# Patient Record
Sex: Female | Born: 1940 | Race: Black or African American | Hispanic: No | Marital: Married | Smoking: Former smoker
Health system: Southern US, Community
[De-identification: ages and names within clinical notes are randomized; demographics above are authoritative.]

## PROBLEM LIST (undated history)

## (undated) DIAGNOSIS — C069 Malignant neoplasm of mouth, unspecified: Secondary | ICD-10-CM

## (undated) DIAGNOSIS — M1990 Unspecified osteoarthritis, unspecified site: Secondary | ICD-10-CM

## (undated) DIAGNOSIS — C801 Malignant (primary) neoplasm, unspecified: Secondary | ICD-10-CM

## (undated) DIAGNOSIS — I10 Essential (primary) hypertension: Secondary | ICD-10-CM

## (undated) HISTORY — PX: ABDOMINAL HYSTERECTOMY: SHX81

---

## 2007-04-24 ENCOUNTER — Ambulatory Visit: Payer: Self-pay | Admitting: Dermatology

## 2007-05-03 ENCOUNTER — Ambulatory Visit: Payer: Self-pay | Admitting: Surgery

## 2008-02-02 ENCOUNTER — Ambulatory Visit: Payer: Self-pay | Admitting: Internal Medicine

## 2010-02-26 ENCOUNTER — Ambulatory Visit: Payer: Self-pay | Admitting: Oncology

## 2010-02-26 ENCOUNTER — Ambulatory Visit: Payer: Self-pay | Admitting: Internal Medicine

## 2010-03-03 ENCOUNTER — Ambulatory Visit: Payer: Self-pay | Admitting: Oncology

## 2010-03-28 ENCOUNTER — Ambulatory Visit: Payer: Self-pay | Admitting: Oncology

## 2013-02-26 ENCOUNTER — Emergency Department: Payer: Self-pay | Admitting: Emergency Medicine

## 2013-02-26 ENCOUNTER — Ambulatory Visit: Payer: Self-pay

## 2013-02-26 LAB — CBC
HCT: 35.9 % (ref 35.0–47.0)
MCHC: 33.3 g/dL (ref 32.0–36.0)
Platelet: 273 10*3/uL (ref 150–440)
RDW: 14.2 % (ref 11.5–14.5)
WBC: 7.3 10*3/uL (ref 3.6–11.0)

## 2013-02-26 LAB — COMPREHENSIVE METABOLIC PANEL
Albumin: 4 g/dL (ref 3.4–5.0)
Alkaline Phosphatase: 80 U/L (ref 50–136)
Anion Gap: 3 — ABNORMAL LOW (ref 7–16)
BUN: 10 mg/dL (ref 7–18)
Bilirubin,Total: 0.2 mg/dL (ref 0.2–1.0)
Calcium, Total: 9.4 mg/dL (ref 8.5–10.1)
Chloride: 105 mmol/L (ref 98–107)
Creatinine: 0.86 mg/dL (ref 0.60–1.30)
EGFR (Non-African Amer.): >60
Glucose: 106 mg/dL — ABNORMAL HIGH (ref 65–99)
Osmolality: 277 (ref 275–301)
Potassium: 3.6 mmol/L (ref 3.5–5.1)
SGOT(AST): 31 U/L (ref 15–37)

## 2013-02-26 LAB — URINALYSIS, COMPLETE
Bilirubin,UR: NEGATIVE
Blood: NEGATIVE
Ketone: NEGATIVE
RBC,UR: 1 /HPF (ref 0–5)
WBC UR: 4 /HPF (ref 0–5)

## 2015-05-31 IMAGING — CT CT HEAD WITHOUT CONTRAST
1 series · 16 of 29 positions shown, 20 images · non-contrast
Comparison: none

REASON FOR EXAM: dizzy
COMMENTS:

[Series 2: soft tissue · axial · 0.41mm/px · z∈[-192,-62]mm · 16 of 29 slices shown, 20 images]
[im 2/29  brain]
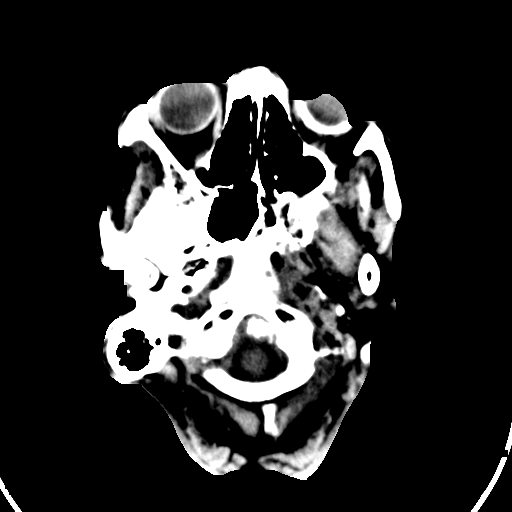
[im 2/29  bone]
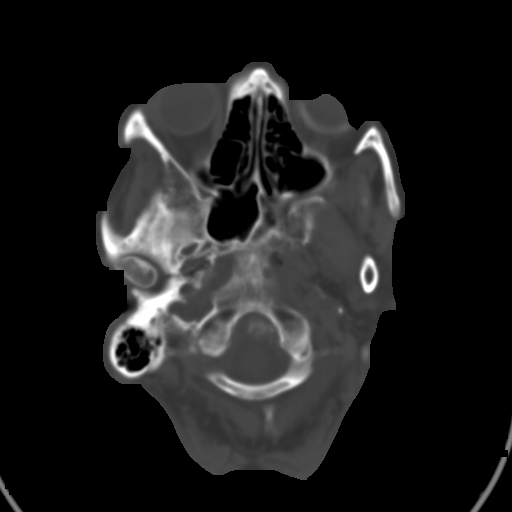
[im 4/29  brain]
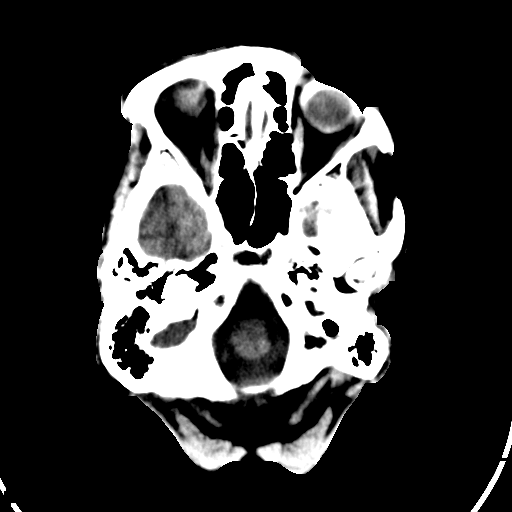
[im 6/29  brain]
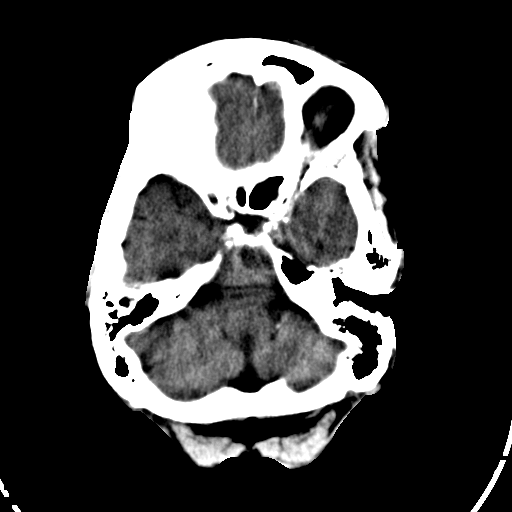
[im 7/29  brain]
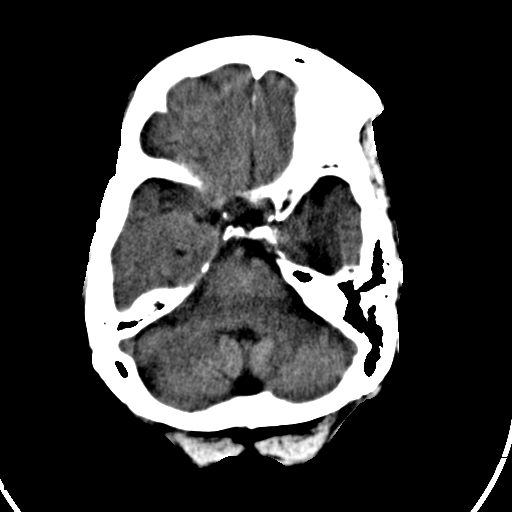
[im 9/29  brain]
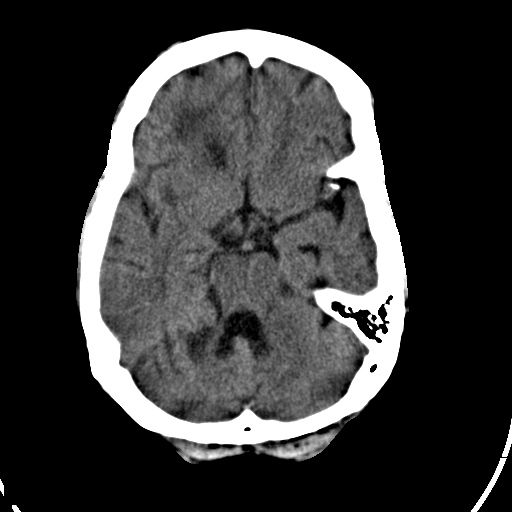
[im 9/29  bone]
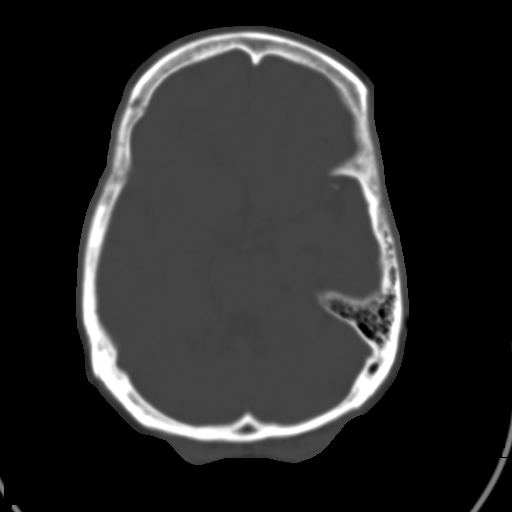
[im 11/29  brain]
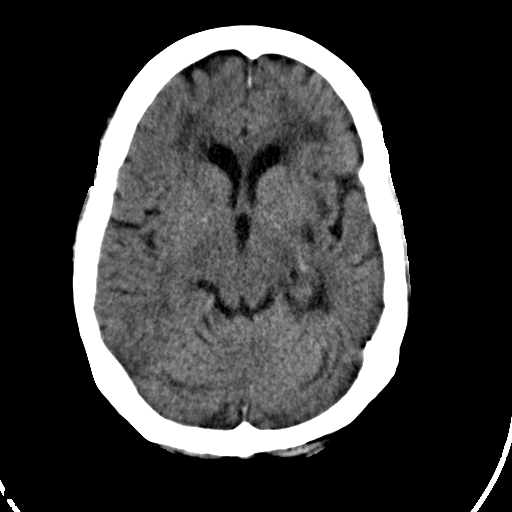
[im 12/29  brain]
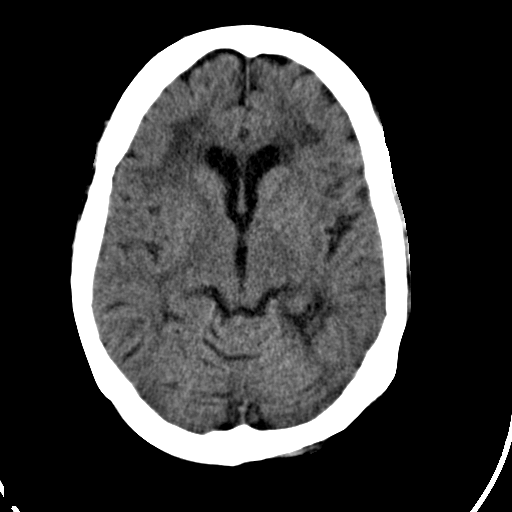
[im 14/29  brain]
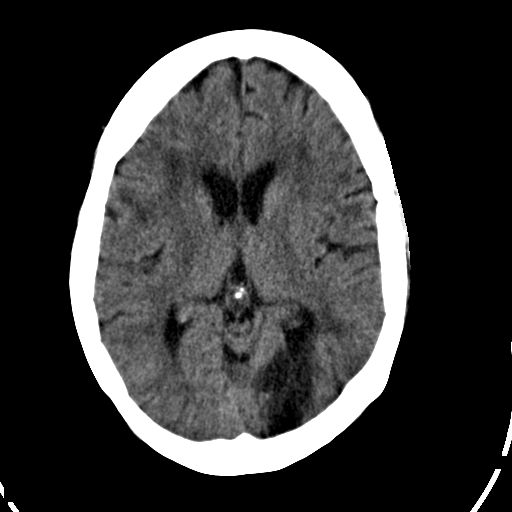
[im 16/29  brain]
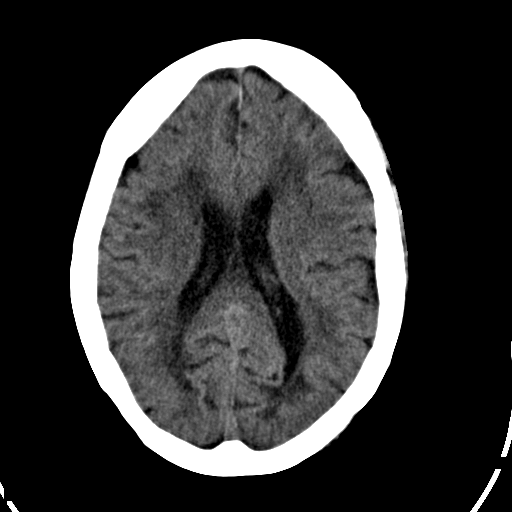
[im 16/29  bone]
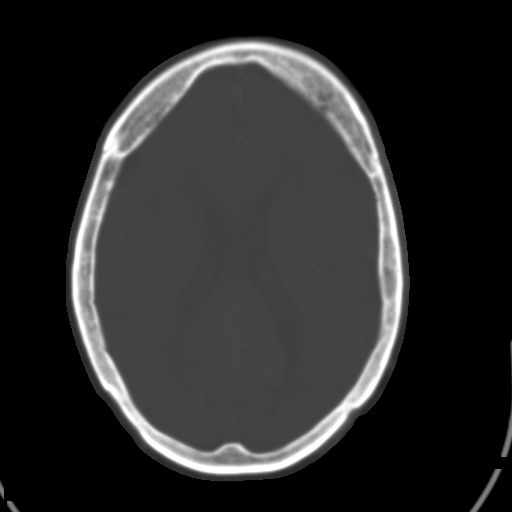
[im 18/29  brain]
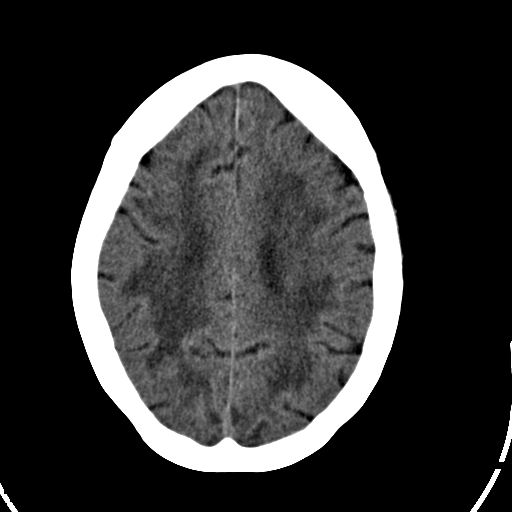
[im 19/29  brain]
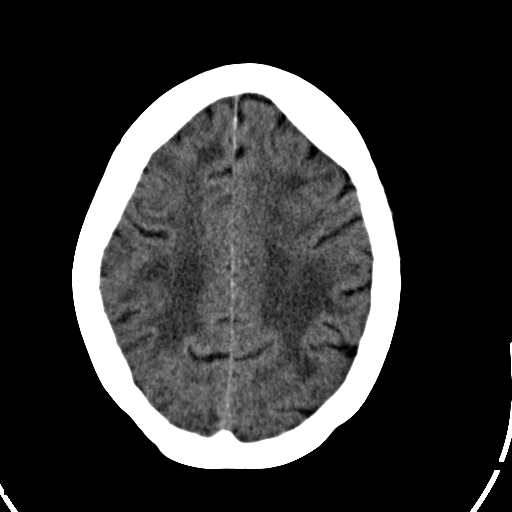
[im 21/29  brain]
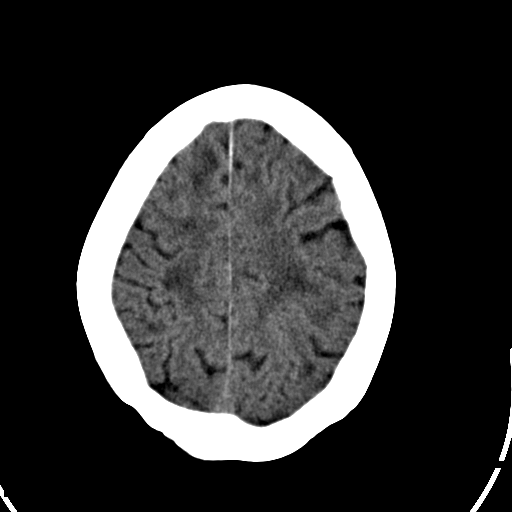
[im 23/29  brain]
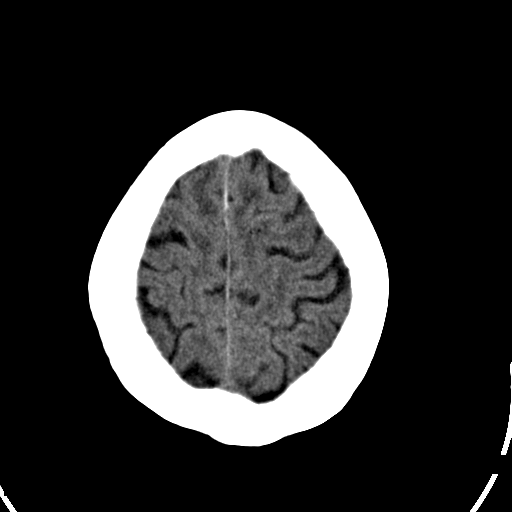
[im 23/29  bone]
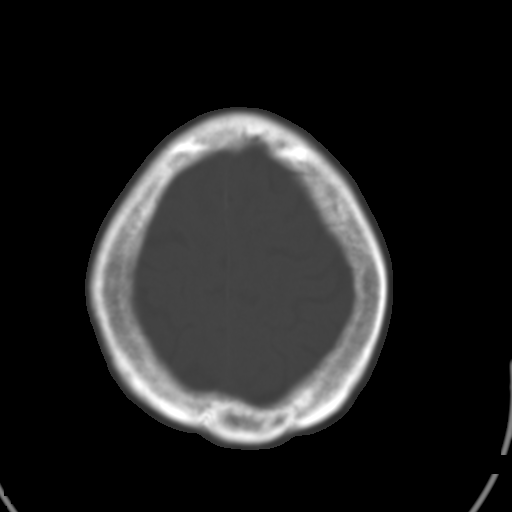
[im 24/29  brain]
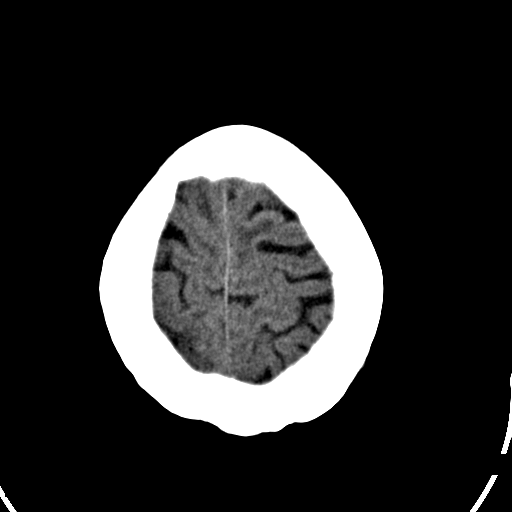
[im 26/29  brain]
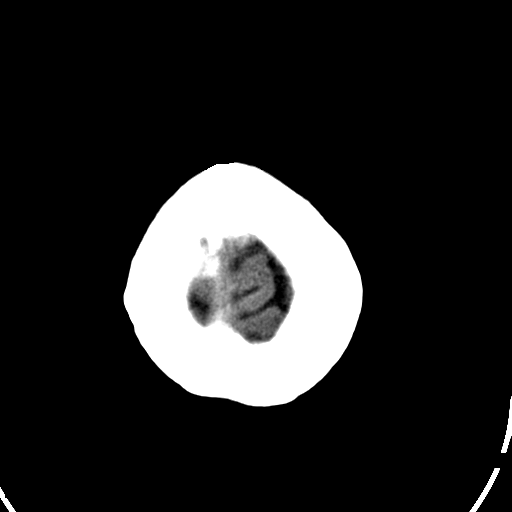
[im 28/29  brain]
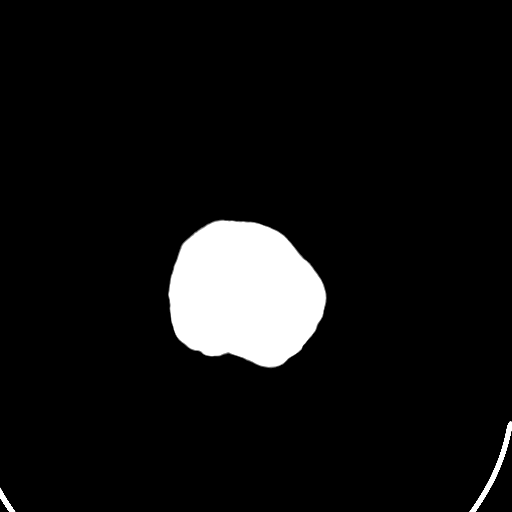

[16 of 29 positions shown; findings below may reference images not displayed]

PROCEDURE:     CT  - CT HEAD WITHOUT CONTRAST  - February 26, 2013  [DATE]

RESULT:     Noncontrast CT of the brain is performed. There is extensive
low-attenuation in the periventricular and subcortical white matter regions.
There is an old-appearing left occipital infarct area there is no evidence
of intracranial hemorrhage, mass, mass effect or midline shift. The sinuses
and mastoid air cells show normal appearing aeration. The calvarium is
intact.
IMPRESSION: 1. Chronic microvascular ischemic disease.
2. Old-appearing left occipital infarct.
3. No acute intracranial abnormality evident.

[REDACTED]

## 2015-08-01 ENCOUNTER — Ambulatory Visit (INDEPENDENT_AMBULATORY_CARE_PROVIDER_SITE_OTHER): Payer: Medicare Other

## 2015-08-01 ENCOUNTER — Ambulatory Visit
Admission: EM | Admit: 2015-08-01 | Discharge: 2015-08-01 | Disposition: A | Discharge status: Home / Self Care | Payer: Medicare Other | Attending: Family Medicine | Admitting: Family Medicine

## 2015-08-01 DIAGNOSIS — M10071 Idiopathic gout, right ankle and foot: Secondary | ICD-10-CM | POA: Diagnosis not present

## 2015-08-01 DIAGNOSIS — M79671 Pain in right foot: Secondary | ICD-10-CM | POA: Diagnosis not present

## 2015-08-01 DIAGNOSIS — M109 Gout, unspecified: Secondary | ICD-10-CM

## 2015-08-01 HISTORY — DX: Unspecified osteoarthritis, unspecified site: M19.90

## 2015-08-01 HISTORY — DX: Malignant (primary) neoplasm, unspecified: C80.1

## 2015-08-01 HISTORY — DX: Malignant neoplasm of mouth, unspecified: C06.9

## 2015-08-01 HISTORY — DX: Essential (primary) hypertension: I10

## 2015-08-01 LAB — CBC WITH DIFFERENTIAL/PLATELET
BASOS ABS: 0.1 10*3/uL (ref 0–0.1)
BASOS PCT: 1 %
EOS ABS: 0.1 10*3/uL (ref 0–0.7)
Eosinophils Relative: 1 %
HEMATOCRIT: 31.6 % — AB (ref 35.0–47.0)
HEMOGLOBIN: 10.3 g/dL — AB (ref 12.0–16.0)
Lymphocytes Relative: 18 %
Lymphs Abs: 1.4 10*3/uL (ref 1.0–3.6)
MCH: 26.9 pg (ref 26.0–34.0)
MCHC: 32.5 g/dL (ref 32.0–36.0)
MCV: 82.5 fL (ref 80.0–100.0)
MONOS PCT: 8 %
Monocytes Absolute: 0.7 10*3/uL (ref 0.2–0.9)
NEUTROS ABS: 5.9 10*3/uL (ref 1.4–6.5)
NEUTROS PCT: 72 %
Platelets: 295 10*3/uL (ref 150–440)
RBC: 3.83 MIL/uL (ref 3.80–5.20)
RDW: 14.8 % — ABNORMAL HIGH (ref 11.5–14.5)
WBC: 8.2 10*3/uL (ref 3.6–11.0)

## 2015-08-01 LAB — BASIC METABOLIC PANEL
Anion gap: 9 (ref 5–15)
BUN: 10 mg/dL (ref 6–20)
CALCIUM: 8.9 mg/dL (ref 8.9–10.3)
CHLORIDE: 102 mmol/L (ref 101–111)
CO2: 27 mmol/L (ref 22–32)
CREATININE: 0.98 mg/dL (ref 0.44–1.00)
GFR calc non Af Amer: 55 mL/min — ABNORMAL LOW (ref >60)
Glucose, Bld: 103 mg/dL — ABNORMAL HIGH (ref 65–99)
Potassium: 3.6 mmol/L (ref 3.5–5.1)
SODIUM: 138 mmol/L (ref 135–145)

## 2015-08-01 LAB — URIC ACID: Uric Acid, Serum: 5.6 mg/dL (ref 2.3–6.6)

## 2015-08-01 MED ORDER — TRAMADOL HCL 50 MG PO TABS: 50.0000 mg | ORAL_TABLET | Freq: Two times a day (BID) | ORAL | Status: AC | PRN

## 2015-08-01 MED ORDER — NAPROXEN 250 MG PO TABS: 250.0000 mg | ORAL_TABLET | Freq: Two times a day (BID) | ORAL | Status: AC

## 2015-08-01 NOTE — ED Provider Notes (Signed)
[facility]  ____________________________________________  Time seen: Approximately 12:12 PM  I have reviewed the triage vital signs and the nursing notes.   HISTORY  Chief Complaint Foot Pain  HPI Nicole Garrett is a 75 y.o. femalepresents with a complaint of right foot pain. Patient reports that right foot pain for the last 4 days. Patient states that the pain as at the base of her right first toe. Denies fall or trauma. Denies any trauma or injury. Patient reports this is a sudden onset and has gradually been improving. Patient reports that initially when pain was present she had no more pain and it is very tender to light touch as well as with walking. Patient states that now at rest she does not have any pain. But states that the pain is an aching throbbing pain with ambulation primarily. Denies any cut or break in skin. Denies any history of similar. Reports no falls at home, recent or past.    Denies any other pain, redness, swelling or drainage. Denies calf pain, other extremity pain. Denies fevers. Reports continues to eat and drink well. States has occasionally taken otc ibuprofen which helped.   PCP: [facility]    Past Medical History  Diagnosis Date  . Hypertension   . Arthritis   . Cancer ([place])   . Oral cancer ([place])   Anemia  There are no active problems to display for this patient.   Past Surgical History  Procedure Laterality Date  . Abdominal hysterectomy      Current Outpatient Rx  Name  Route  Sig  Dispense  Refill  . clopidogrel (PLAVIX) 75 MG tablet   Oral   Take 75 mg by mouth daily.         Marland Kitchen amLODipine (NORVASC) 10 MG tablet   Oral   Take 10 mg by mouth daily.         . brimonidine-timolol (COMBIGAN) 0.2-0.5 % ophthalmic solution   Both Eyes   Place 1 drop into both eyes every 12 (twelve) hours.         . Calcium Carbonate-Vitamin D (CALCIUM-VITAMIN D) 500-200 MG-UNIT tablet   Oral   Take 1 tablet by mouth daily.         Marland Kitchen  lisinopril (PRINIVIL,ZESTRIL) 20 MG tablet   Oral   Take 20 mg by mouth daily.         Marland Kitchen lovastatin (MEVACOR) 40 MG tablet   Oral   Take 40 mg by mouth at bedtime.         . pilocarpine (SALAGEN) 5 MG tablet   Oral   Take by mouth 2 (two) times daily.         . travoprost, benzalkonium, (TRAVATAN) 0.004 % ophthalmic solution      1 drop at bedtime.           Allergies Review of patient's allergies indicates no known allergies.  History reviewed. No pertinent family history.  Social History Social History  Substance Use Topics  . Smoking status: Former Research scientist (life sciences)  . Smokeless tobacco: None  . Alcohol Use: Yes     Comment: socially    Review of Systems Constitutional: No fever/chills Eyes: No visual changes. ENT: No sore throat. Cardiovascular: Denies chest pain. Respiratory: Denies shortness of breath. Gastrointestinal: No abdominal pain.  No nausea, no vomiting.  No diarrhea.  No constipation. Denies blood in stool or toilet or dark stools.  Genitourinary: Negative for dysuria.Denies hematuria.  Musculoskeletal: Negative for back pain.Right  foot pain.  Skin: Negative for rash. Denies abnormal bruising. Denies abnormal bleeding.  Neurological: Negative for headaches, focal weakness or numbness.  10-point ROS otherwise negative.  ____________________________________________   PHYSICAL EXAM:  VITAL SIGNS: ED Triage Vitals  Enc Vitals Group     BP 08/01/15 1129 90/56 mmHg     Pulse Rate 08/01/15 1129 60     Resp 08/01/15 1129 16     Temp 08/01/15 1129 97.7 F (36.5 C)     Temp Source 08/01/15 1129 Tympanic     SpO2 08/01/15 1129 100 %     Weight 08/01/15 1129 120 lb (54.432 kg)     Height 08/01/15 1129 5\' 5"  (1.651 m)     Head Cir --      Peak Flow --      Pain Score 08/01/15 1132 4     Pain Loc --      Pain Edu? --      Excl. in [place]? --    Today's Vitals   08/01/15 1129 08/01/15 1132 08/01/15 1228 08/01/15 1340  BP: 90/56  102/66   Pulse: 60   56   Temp: 97.7 F (36.5 C)  97 F (36.1 C)   TempSrc: Tympanic  Tympanic   Resp: 16  16   Height: 5\' 5"  (1.651 m)     Weight: 120 lb (54.432 kg)     SpO2: 100%  100%   PainSc:  4   1     Constitutional: Alert and oriented. Well appearing and in no acute distress. Eyes: Conjunctivae are normal. PERRL. EOMI. Head: Atraumatic.  Nose: No congestion/rhinnorhea.  Mouth/Throat: Mucous membranes are moist.   Neck: No stridor.  No cervical spine tenderness to palpation. Cardiovascular: Normal rate, regular rhythm. Grossly normal heart sounds.  Good peripheral circulation. Respiratory: Normal respiratory effort.  No retractions. Lungs CTAB. Gastrointestinal: Soft and nontender. Musculoskeletal: No lower or upper extremity tenderness nor edema.  Bilateral pedal pulses equal and easily palpated.  Except: Mild to moderate swelling medially at right first MTP joint with mild erythema directly at MTP joint, no fluctuance or induration, no surrounding erythema, no exudate, skin intact, full range of motion, sensation and tendon intact, cap refill < 2seconds to all toes right foot. Right foot otherwise nontender. Bilateral pedal pulses equal and easily palpable. No calf tenderness bilaterally. Ambulatory with steady gait. Neurologic:  Normal speech and language. No gross focal neurologic deficits are appreciated. No gait instability. Skin:  Skin is warm, dry and intact. No rash noted. Psychiatric: Mood and affect are normal. Speech and behavior are normal.  ____________________________________________   LABS (all labs ordered are listed, but only abnormal results are displayed)  Labs Reviewed  CBC WITH DIFFERENTIAL/PLATELET - Abnormal; Notable for the following:    Hemoglobin 10.3 (*)    HCT 31.6 (*)    RDW 14.8 (*)    All other components within normal limits  BASIC METABOLIC PANEL - Abnormal; Notable for the following:    Glucose, Bld 103 (*)    GFR calc non Af Amer 55 (*)    All other  components within normal limits  URIC ACID   RADIOLOGY  RIGHT GREAT TOE  COMPARISON: None.  FINDINGS: Three views of the right great toe submitted. There is hallux valgus deformity. No acute fracture or subluxation. Soft tissue swelling noted medial to distal aspect of first metatarsal. Subtle soft tissue calcifications are noted adjacent to distal medial aspect of fourth metatarsal. Although there is no bony  erosion findings are suspicious for gout. Clinical correlation is necessary.  IMPRESSION: There is hallux valgus deformity. No acute fracture or subluxation. Soft tissue swelling noted medial to distal aspect of first metatarsal. Subtle soft tissue calcifications are noted adjacent to distal medial aspect of fourth metatarsal. Although there is no bony erosion findings are suspicious for gout. Clinical correlation is necessary.   Electronically Signed By: Lahoma Crocker M.D. On: 08/01/2015 12:19  I, Marylene Land, personally viewed and evaluated these images (plain radiographs) as part of my medical decision making, as well as reviewing the written report by the radiologist.    [place] / [place] / ED COURSE  Pertinent labs & imaging results that were available during my care of the patient were reviewed by me and considered in my medical decision making (see chart for details).  Very well-appearing patient. No acute distress. Presents for the complaint of right foot pain. Patient reports right foot pain is been present 4 days. Denies fall or known trauma. Patient reports that pain was initially worse but has gradually improved. Patient states that pain is now primarily with walking. Patient states that initially pain was present even with very light touch to the skin. Mild to moderate swelling at right first MTP joint with mild erythema, no fluctuance, no exudate, skin intact, full range of motion, sensation and tendon intact. Right foot otherwise  nontender. Bilateral pedal pulses equal and easily palpable. No calf tenderness bilaterally. Ambulatory with steady gait.Suspect gouty pain.   Labs reviewed. Discussed with patient hemoglobin and encouraged PCP follow up, patient reports chronic lower hemoglobin. Right great toe xray reviewed; per radiologist hallux valgus deformity, no acute fracture or subluxation, soft tissue swelling noted medial to distal aspect of first metatarsal, subtle soft tissue calcifications are noted adjacent to distal medial aspect of fourth metatarsal, no bony erosion, findings suspicious for gout.   Suspect gouty pain. Less likely acute skin infection. Patient reports improving. Will treat with prn naproxen 250mg  BID and 0.5-1 tablet of tramadol prn BID as needed for pain, rest, elevation and close monitoring. Recommended close PCP follow up in 2-3 days for follow up.   Discussed follow up with Primary care physician this week. Discussed follow up and return parameters including increased pain, swelling, redness or no resolution or any worsening concerns. Patient verbalized understanding and agreed to plan.   ____________________________________________   FINAL CLINICAL IMPRESSION(S) / ED DIAGNOSES  Final diagnoses:  Right foot pain  Acute gout of right foot, unspecified cause      Note: This dictation was prepared with Dragon dictation along with smaller phrase technology. Any transcriptional errors that result from this process are unintentional.    Marylene Land, NP 08/01/15 1511

## 2015-08-01 NOTE — Discharge Instructions (Signed)
Take medication as prescribed. Rest. Drink plenty of fluids. Elevate and monitor area.   Follow up with your primary care physician in 3 days. Return to Urgent care or ER for increased redness, swelling, pain, new or worsening concerns.    Gout Gout is an inflammatory arthritis caused by a buildup of uric acid crystals in the joints. Uric acid is a chemical that is normally present in the blood. When the level of uric acid in the blood is too high it can form crystals that deposit in your joints and tissues. This causes joint redness, soreness, and swelling (inflammation). Repeat attacks are common. Over time, uric acid crystals can form into masses (tophi) near a joint, destroying bone and causing disfigurement. Gout is treatable and often preventable. CAUSES  The disease begins with elevated levels of uric acid in the blood. Uric acid is produced by your body when it breaks down a naturally found substance called purines. Certain foods you eat, such as meats and fish, contain high amounts of purines. Causes of an elevated uric acid level include:  Being passed down from parent to child (heredity).  Diseases that cause increased uric acid production (such as obesity, psoriasis, and certain cancers).  Excessive alcohol use.  Diet, especially diets rich in meat and seafood.  Medicines, including certain cancer-fighting medicines (chemotherapy), water pills (diuretics), and aspirin.  Chronic kidney disease. The kidneys are no longer able to remove uric acid well.  Problems with metabolism. Conditions strongly associated with gout include:  Obesity.  High blood pressure.  High cholesterol.  Diabetes. Not everyone with elevated uric acid levels gets gout. It is not understood why some people get gout and others do not. Surgery, joint injury, and eating too much of certain foods are some of the factors that can lead to gout attacks. SYMPTOMS   An attack of gout comes on quickly. It  causes intense pain with redness, swelling, and warmth in a joint.  Fever can occur.  Often, only one joint is involved. Certain joints are more commonly involved:  Base of the big toe.  Knee.  Ankle.  Wrist.  Finger. Without treatment, an attack usually goes away in a few days to weeks. Between attacks, you usually will not have symptoms, which is different from many other forms of arthritis. DIAGNOSIS  Your caregiver will suspect gout based on your symptoms and exam. In some cases, tests may be recommended. The tests may include:  Blood tests.  Urine tests.  X-rays.  Joint fluid exam. This exam requires a needle to remove fluid from the joint (arthrocentesis). Using a microscope, gout is confirmed when uric acid crystals are seen in the joint fluid. TREATMENT  There are two phases to gout treatment: treating the sudden onset (acute) attack and preventing attacks (prophylaxis).  Treatment of an Acute Attack.  Medicines are used. These include anti-inflammatory medicines or steroid medicines.  An injection of steroid medicine into the affected joint is sometimes necessary.  The painful joint is rested. Movement can worsen the arthritis.  You may use warm or cold treatments on painful joints, depending which works best for you.  Treatment to Prevent Attacks.  If you suffer from frequent gout attacks, your caregiver may advise preventive medicine. These medicines are started after the acute attack subsides. These medicines either help your kidneys eliminate uric acid from your body or decrease your uric acid production. You may need to stay on these medicines for a very [place] time.  The early phase  of treatment with preventive medicine can be associated with an increase in acute gout attacks. For this reason, during the first few months of treatment, your caregiver may also advise you to take medicines usually used for acute gout treatment. Be sure you understand your  caregiver's directions. Your caregiver may make several adjustments to your medicine dose before these medicines are effective.  Discuss dietary treatment with your caregiver or dietitian. Alcohol and drinks high in sugar and fructose and foods such as meat, poultry, and seafood can increase uric acid levels. Your caregiver or dietitian can advise you on drinks and foods that should be limited. HOME CARE INSTRUCTIONS   Do not take aspirin to relieve pain. This raises uric acid levels.  Only take over-the-counter or prescription medicines for pain, discomfort, or fever as directed by your caregiver.  Rest the joint as much as possible. When in bed, keep sheets and blankets off painful areas.  Keep the affected joint raised (elevated).  Apply warm or cold treatments to painful joints. Use of warm or cold treatments depends on which works best for you.  Use crutches if the painful joint is in your leg.  Drink enough fluids to keep your urine clear or pale yellow. This helps your body get rid of uric acid. Limit alcohol, sugary drinks, and fructose drinks.  Follow your dietary instructions. Pay careful attention to the amount of protein you eat. Your daily diet should emphasize fruits, vegetables, whole grains, and fat-free or low-fat milk products. Discuss the use of coffee, vitamin C, and cherries with your caregiver or dietitian. These may be helpful in lowering uric acid levels.  Maintain a healthy body weight. SEEK MEDICAL CARE IF:   You develop diarrhea, vomiting, or any side effects from medicines.  You do not feel better in 24 hours, or you are getting worse. SEEK IMMEDIATE MEDICAL CARE IF:   Your joint becomes suddenly more tender, and you have chills or a fever. MAKE SURE YOU:   Understand these instructions.  Will watch your condition.  Will get help right away if you are not doing well or get worse.   This information is not intended to replace advice given to you by  your health care provider. Make sure you discuss any questions you have with your health care provider.   Document Released: 06/11/2000 Document Revised: 07/05/2014 Document Reviewed: 01/26/2012 Elsevier Interactive Patient Education [employer].

## 2015-08-01 NOTE — ED Notes (Signed)
C/o pain and swelling with redness right great toe since Sunday. Painful to walk

## 2017-11-02 IMAGING — CR DG TOE GREAT 2+V*R*
3 series · 3 of 3 positions shown · non-contrast
Comparison: None.

CLINICAL DATA: Pain and swelling redness great toe for 6 days

EXAM:
RIGHT GREAT TOE

[toe ap]
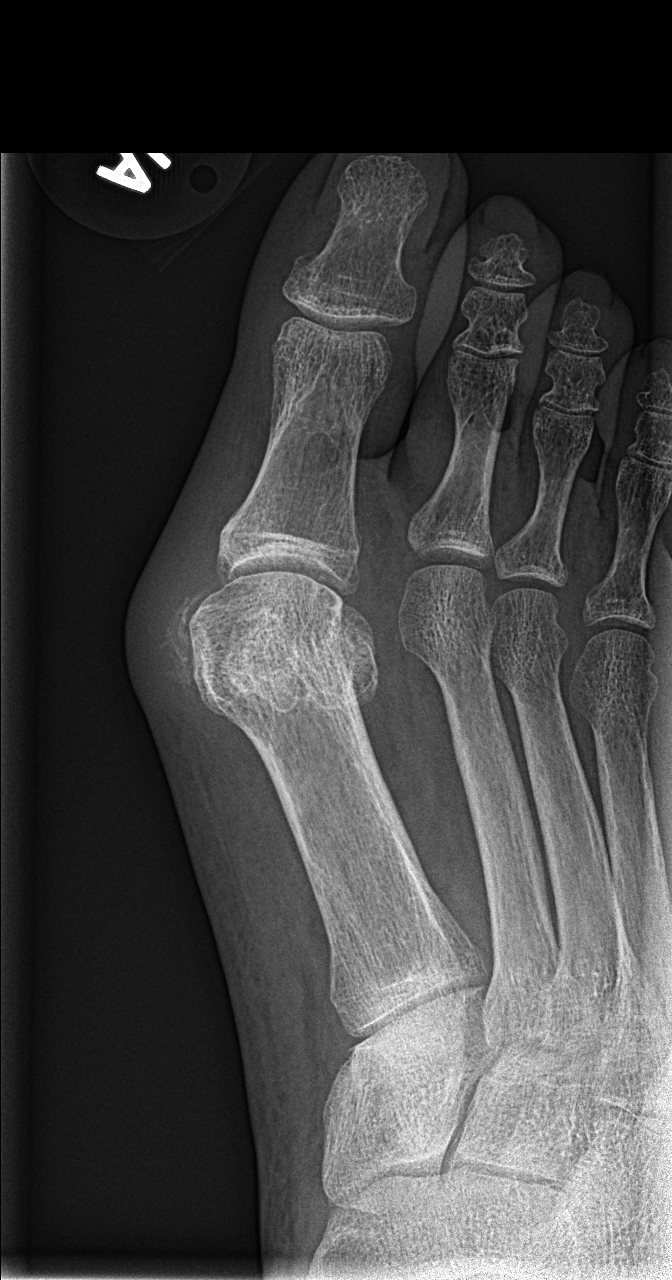

[toe obl]
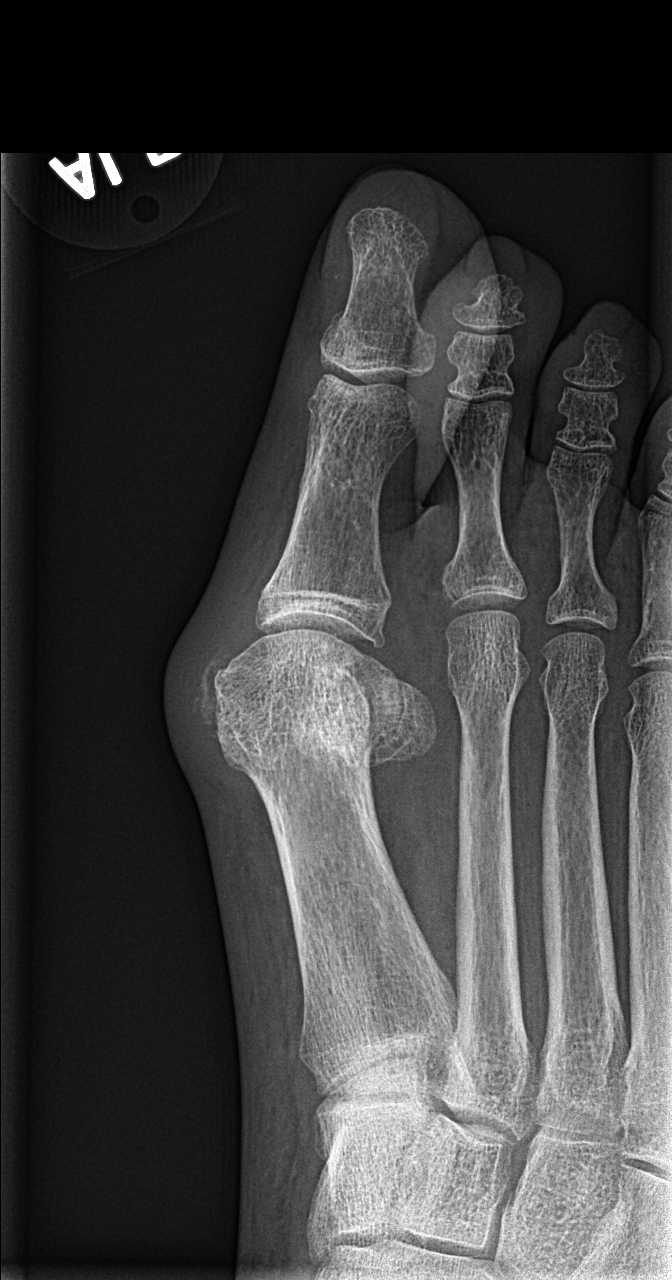

[toe lat]
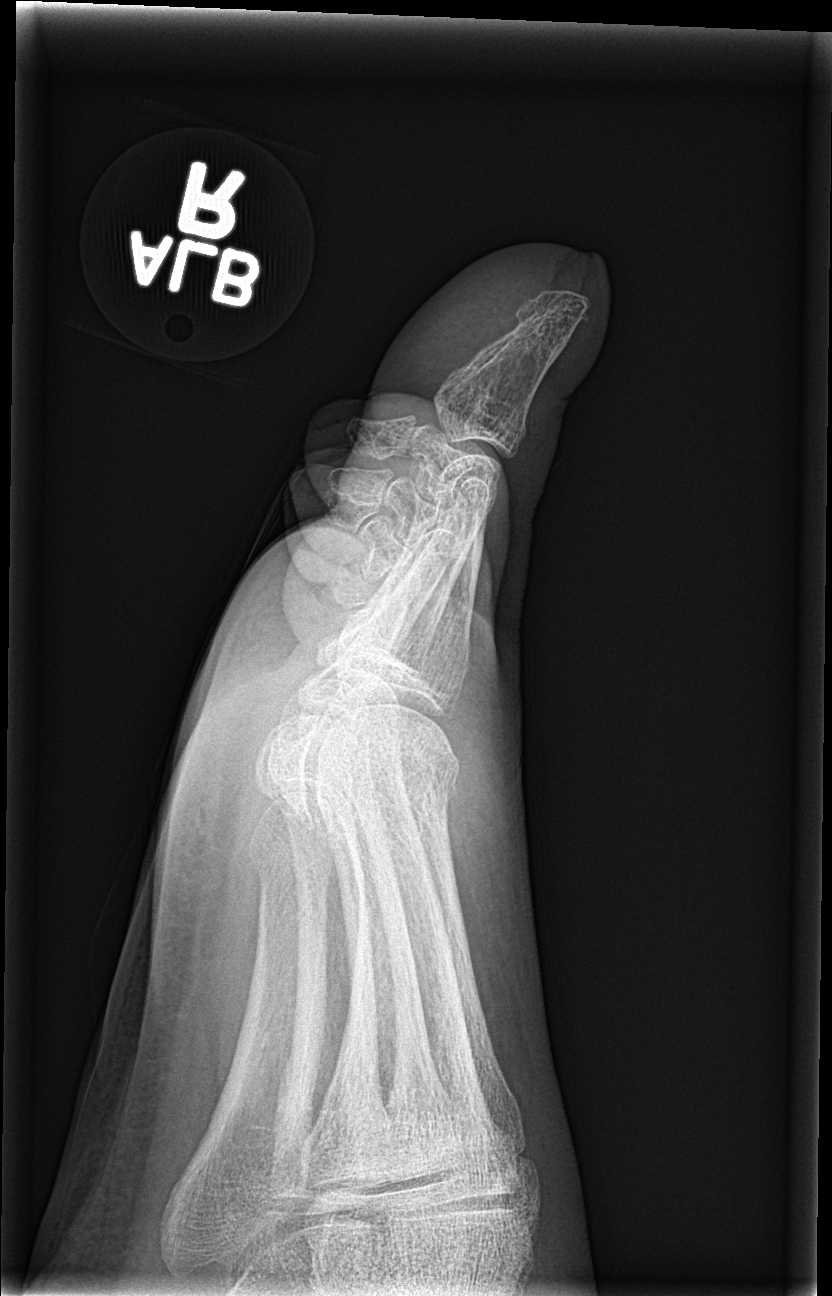

[3 of 3 positions shown; findings below may reference images not displayed]

FINDINGS: Three views of the right great toe submitted. There is hallux valgus
deformity. No acute fracture or subluxation. Soft tissue swelling
noted medial to distal aspect of first metatarsal. Subtle soft
tissue calcifications are noted adjacent to distal medial aspect of
fourth metatarsal. Although there is no bony erosion findings are
suspicious for gout. Clinical correlation is necessary.
IMPRESSION: There is hallux valgus deformity. No acute fracture or subluxation.
Soft tissue swelling noted medial to distal aspect of first
metatarsal. Subtle soft tissue calcifications are noted adjacent to
distal medial aspect of fourth metatarsal. Although there is no bony
erosion findings are suspicious for gout. Clinical correlation is
necessary.

## 2020-06-28 DEATH — deceased
